# Patient Record
Sex: Male | Born: 2000 | Race: White | Hispanic: No | Marital: Single | Smoking: Never smoker
Health system: Southern US, Community
[De-identification: ages and names within clinical notes are randomized; demographics above are authoritative.]

---
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
## 2020-02-06 IMAGING — CR DG HAND COMPLETE 3+V*R*
3 series · 3 of 3 positions shown · non-contrast
Comparison: None.

CLINICAL DATA: Pain at the second, third and fourth MTP joints over
the last week. Punching injury. Swelling.

EXAM:
RIGHT HAND - COMPLETE 3+ VIEW

[hand pa]
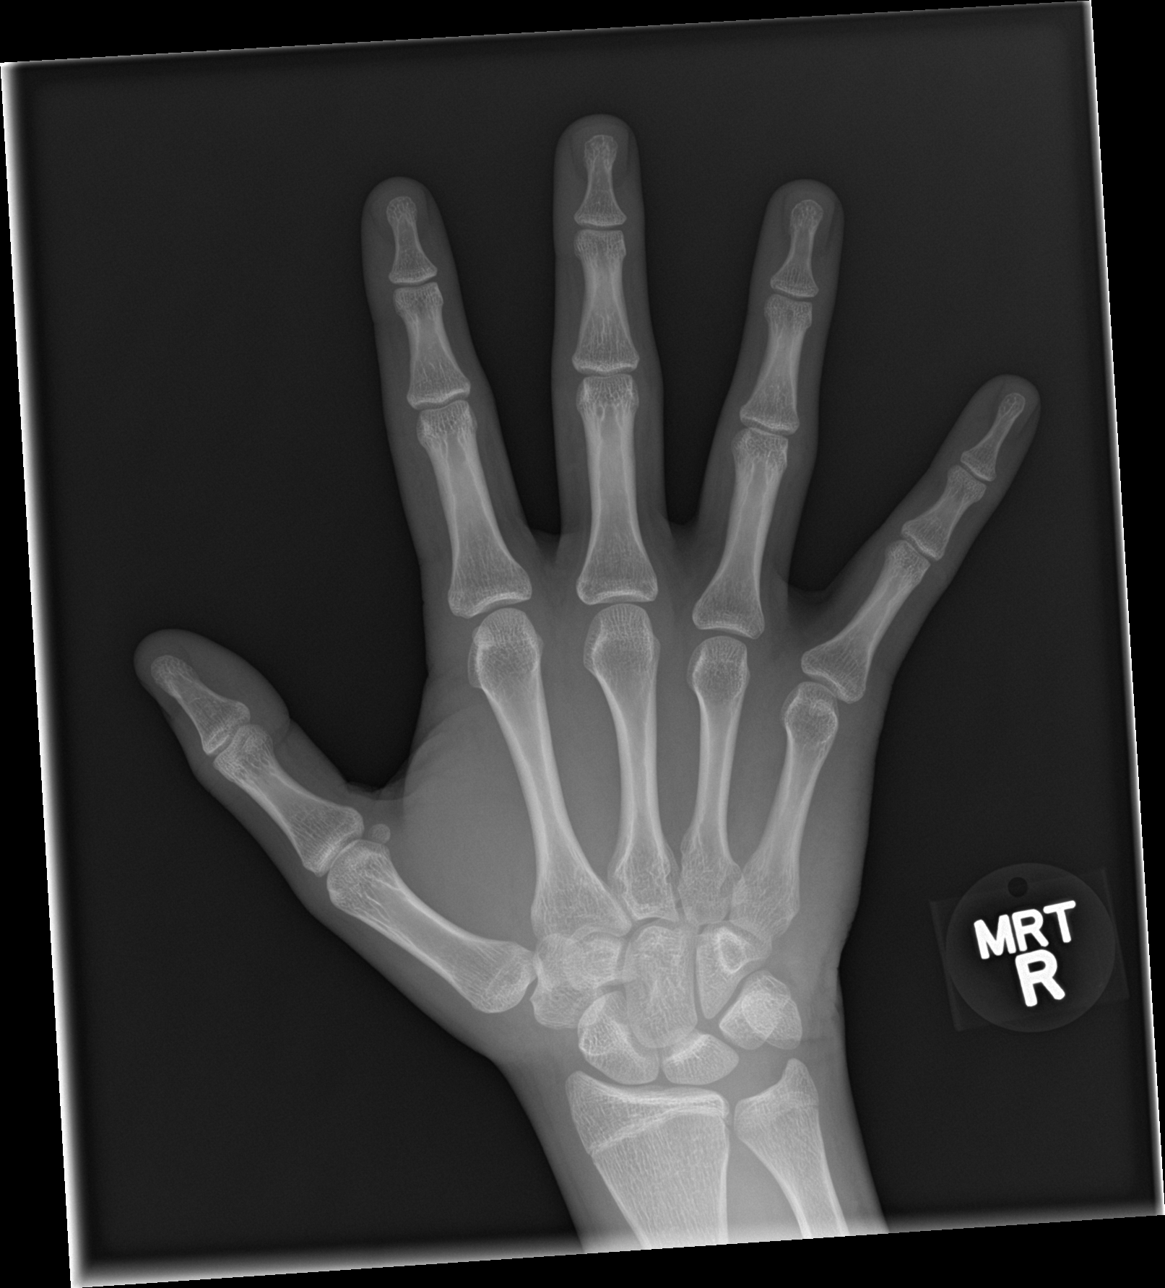

[hand obl]
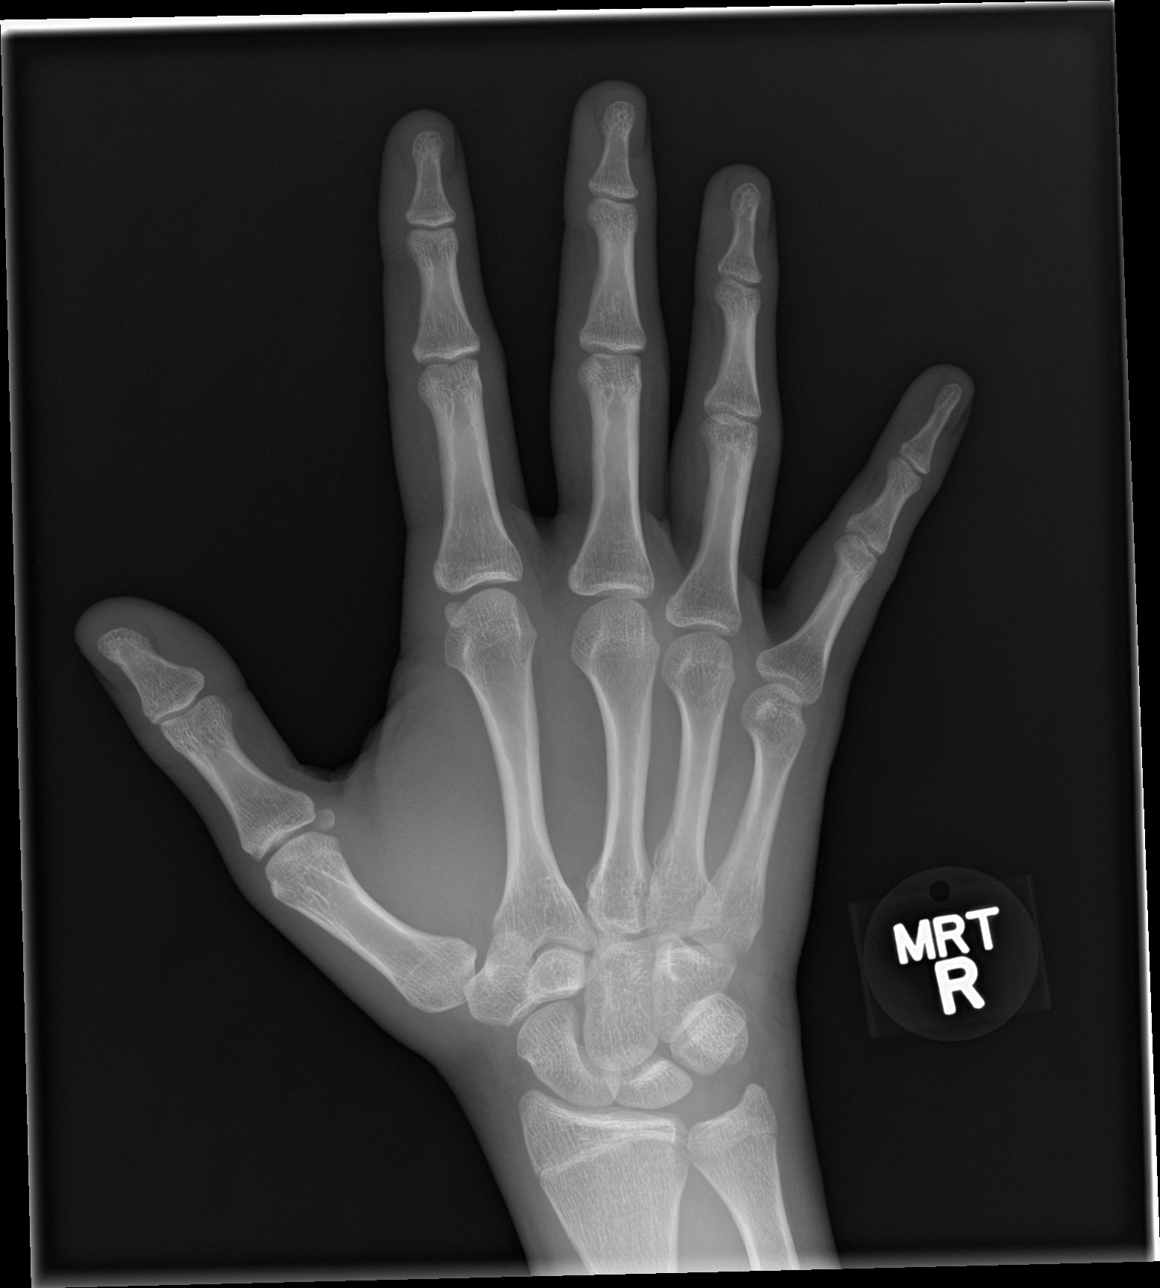

[hand lat]
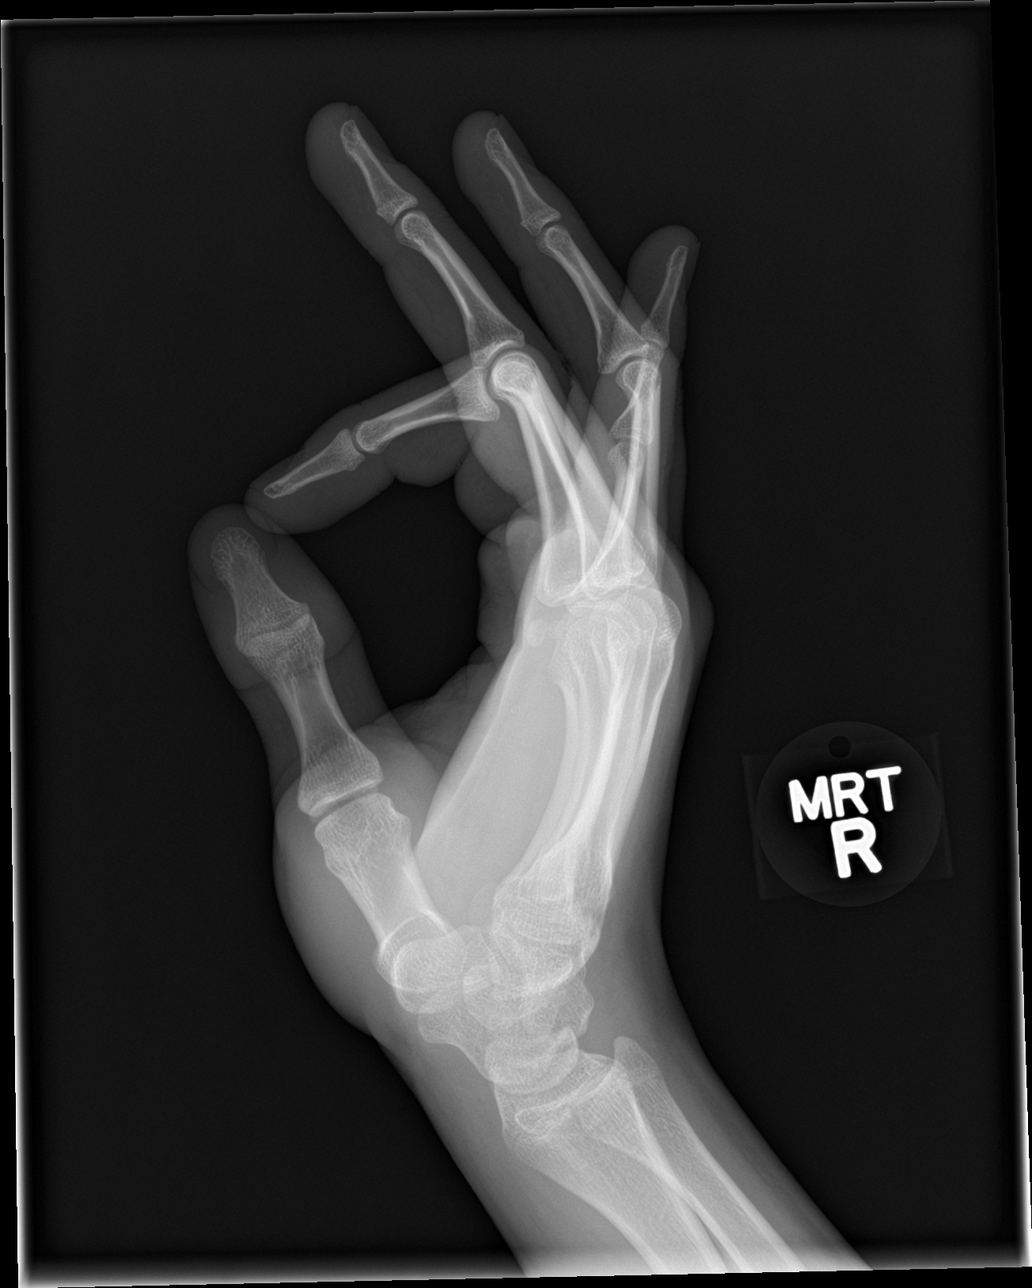

[3 of 3 positions shown; findings below may reference images not displayed]

FINDINGS: There is no evidence of fracture or dislocation. There is no
evidence of arthropathy or other focal bone abnormality. Soft
tissues are unremarkable.
IMPRESSION: Negative radiographs.

## 2020-04-20 IMAGING — DX DG HAND COMPLETE 3+V*R*
3 series · 3 of 3 positions shown · non-contrast
Comparison: None.

CLINICAL DATA: Right hand pain at third and fourth MCP joints.
Injury today.

EXAM:
RIGHT HAND - COMPLETE 3+ VIEW

[hand pa]
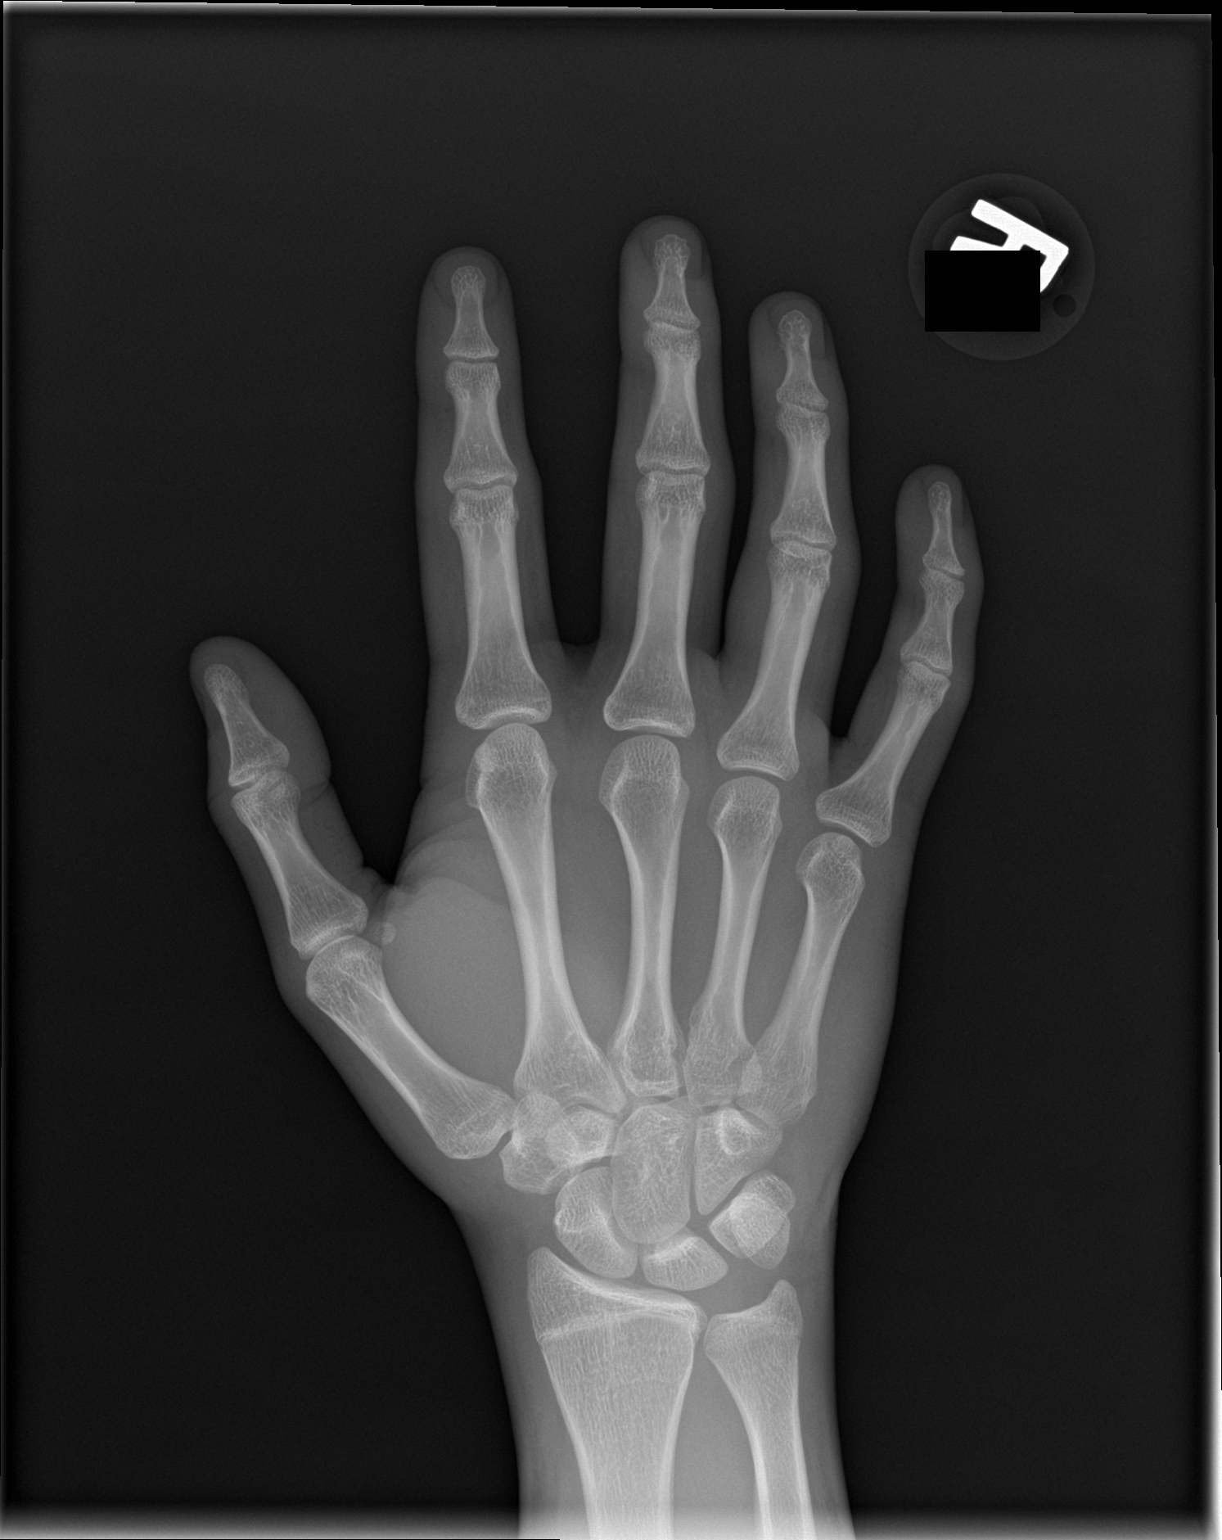

[hand obl]
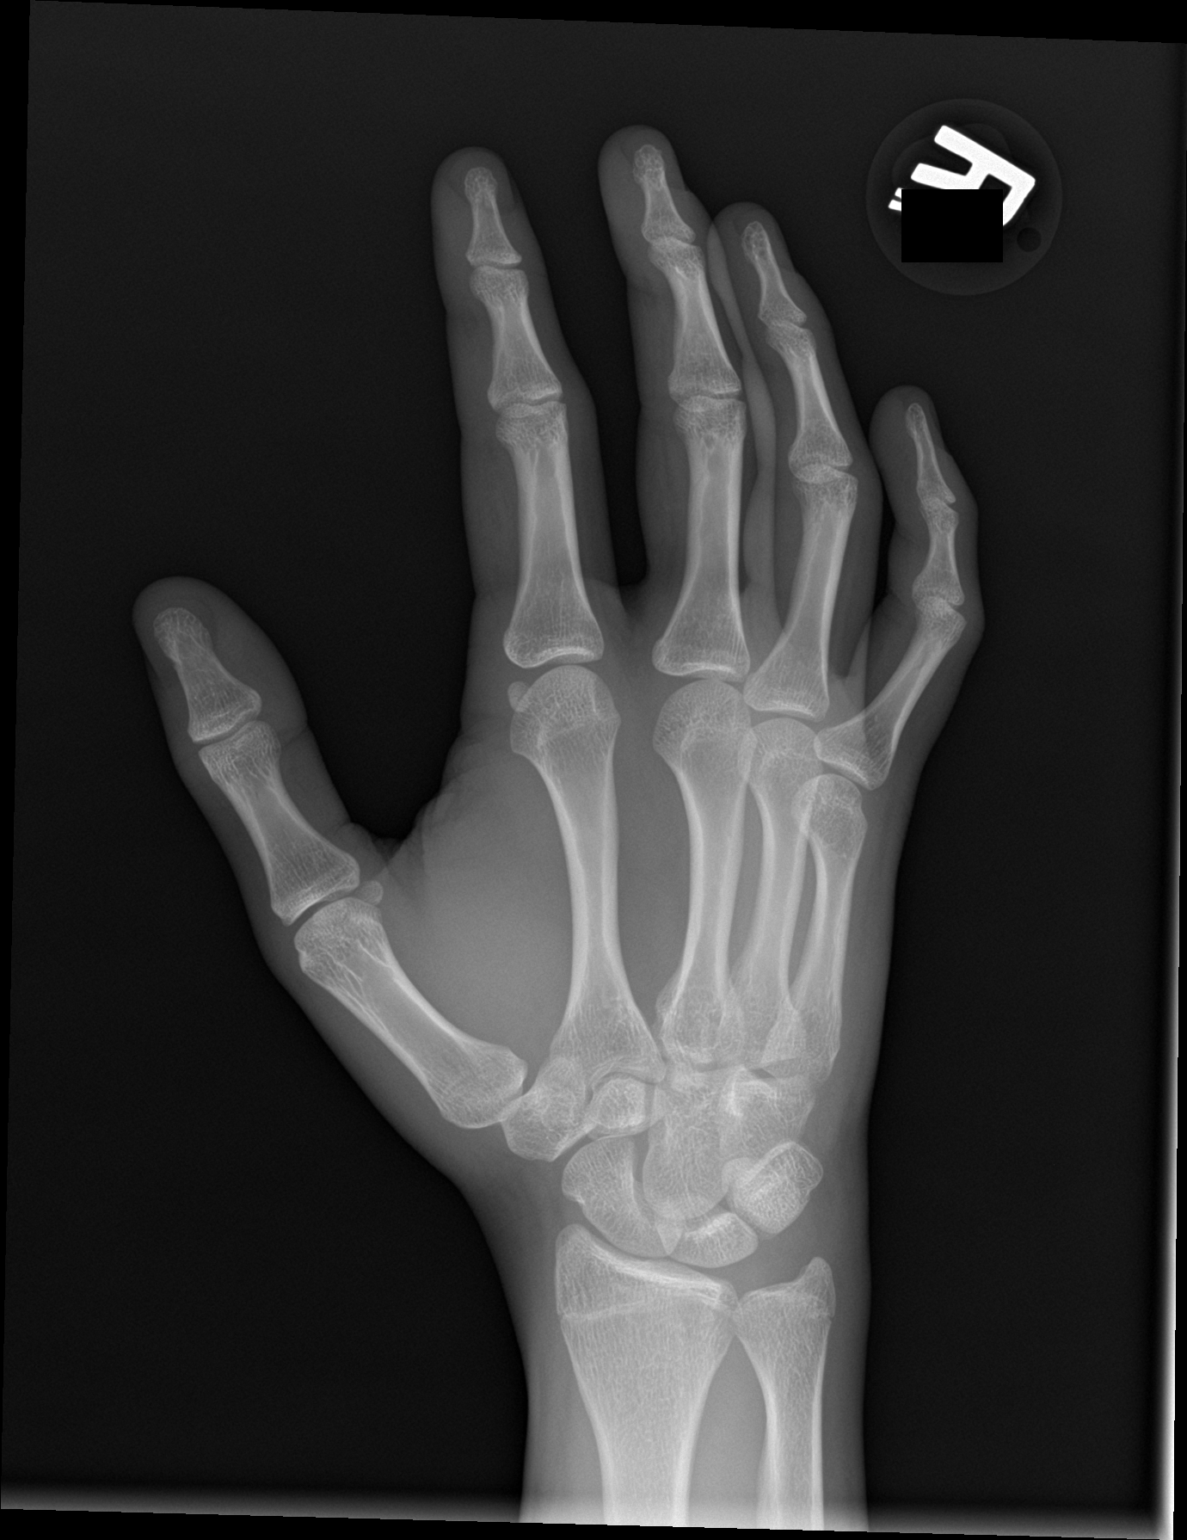

[hand lat]
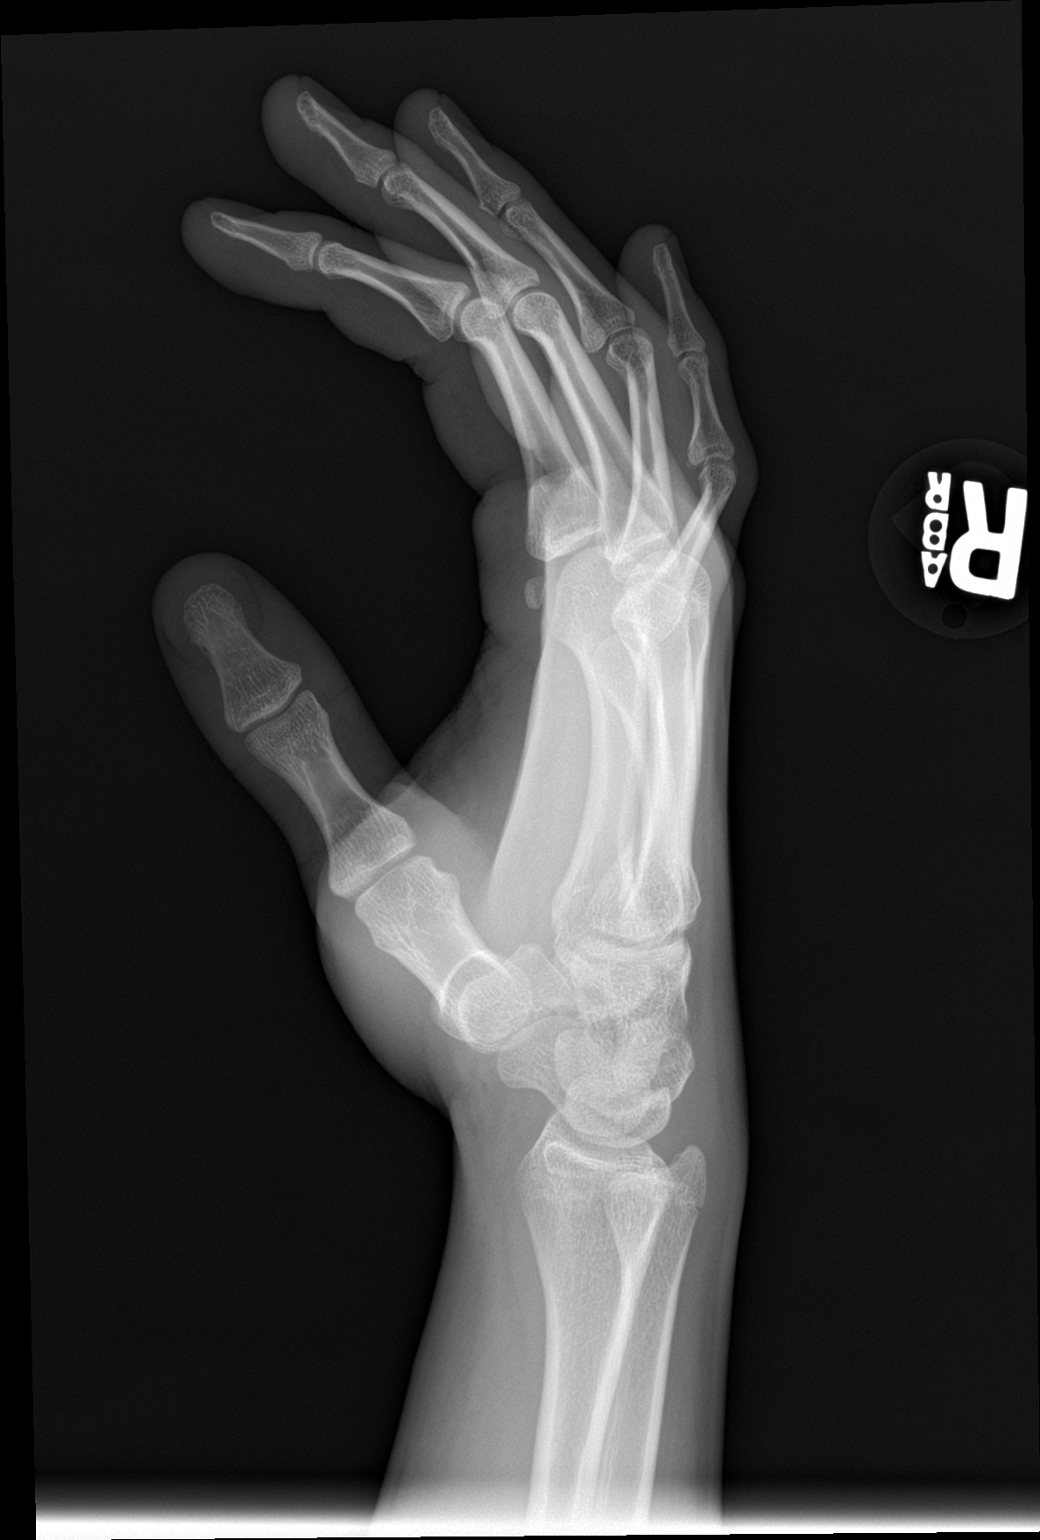

[3 of 3 positions shown; findings below may reference images not displayed]

FINDINGS: There is no evidence of fracture or dislocation. There is no
evidence of arthropathy or other focal bone abnormality. Soft
tissues are unremarkable.
IMPRESSION: Negative.

## 2021-01-03 ENCOUNTER — Emergency Department (HOSPITAL_COMMUNITY)
Admission: EM | Admit: 2021-01-03 | Discharge: 2021-01-04 | Disposition: A | Discharge status: Home / Self Care | Payer: Medicaid Other | Attending: Emergency Medicine | Admitting: Emergency Medicine

## 2021-01-03 ENCOUNTER — Encounter (HOSPITAL_COMMUNITY): Payer: Self-pay | Admitting: Emergency Medicine

## 2021-01-03 ENCOUNTER — Other Ambulatory Visit: Payer: Self-pay

## 2021-01-03 DIAGNOSIS — Z7722 Contact with and (suspected) exposure to environmental tobacco smoke (acute) (chronic): Secondary | ICD-10-CM | POA: Insufficient documentation

## 2021-01-03 DIAGNOSIS — Z23 Encounter for immunization: Secondary | ICD-10-CM | POA: Diagnosis not present

## 2021-01-03 DIAGNOSIS — S61511A Laceration without foreign body of right wrist, initial encounter: Secondary | ICD-10-CM | POA: Insufficient documentation

## 2021-01-03 DIAGNOSIS — S6991XA Unspecified injury of right wrist, hand and finger(s), initial encounter: Secondary | ICD-10-CM | POA: Diagnosis present

## 2021-01-03 DIAGNOSIS — W25XXXA Contact with sharp glass, initial encounter: Secondary | ICD-10-CM | POA: Diagnosis not present

## 2021-01-03 MED ORDER — TETANUS-DIPHTH-ACELL PERTUSSIS 5-2.5-18.5 LF-MCG/0.5 IM SUSY
0.5000 mL | PREFILLED_SYRINGE | Freq: Once | INTRAMUSCULAR | Status: AC
  Administered 2021-01-03: 0.5 mL via INTRAMUSCULAR
  Filled 2021-01-03: qty 0.5

## 2021-01-03 MED ORDER — LIDOCAINE-EPINEPHRINE (PF) 2 %-1:200000 IJ SOLN
INTRAMUSCULAR | Status: AC
  Administered 2021-01-03: 20 mL via INTRADERMAL
  Filled 2021-01-03: qty 20

## 2021-01-03 MED ORDER — LIDOCAINE-EPINEPHRINE (PF) 2 %-1:200000 IJ SOLN: 20.0000 mL | Freq: Once | INTRAMUSCULAR | Status: AC

## 2021-01-03 NOTE — ED Triage Notes (Signed)
Pt was moving a glass coffee table when it shattered cutting the top of his R wrist area.

## 2021-01-03 NOTE — ED Provider Notes (Signed)
[facility] EMERGENCY DEPARTMENT Provider Note   CSN: [id number] Arrival date & time: 01/03/21  2321     History Chief Complaint  Patient presents with  . Laceration    Angel Harvey is a 20 y.o. male.  Patient presents with complaints of laceration to the back of his wrist.  Reports that he was moving a glass table at home when the table broke and accidentally caught him.  Patient reports a fair amount of bleeding initially.  He has pain at the site, no numbness, tingling.  Able to move all fingers.        History reviewed. No pertinent past medical history.  There are no problems to display for this patient.   History reviewed. No pertinent surgical history.     History reviewed. No pertinent family history.  Social History   Tobacco Use  . Smoking status: Passive Smoke Exposure - Never Smoker  . Smokeless tobacco: Never Used  Substance Use Topics  . Alcohol use: Never  . Drug use: Never    Home Medications Prior to Admission medications   Medication Sig Start Date End Date Taking? Authorizing Provider  hyoscyamine (LEVSIN SL) 0.125 MG SL tablet Place 1 tablet (0.125 mg total) under the tongue every 4 (four) hours as needed. 10/08/17   Adelene Amas, MD    Allergies    Patient has no known allergies.  Review of Systems   Review of Systems  Skin: Positive for wound.  Neurological: Negative.     Physical Exam Updated Vital Signs BP (!) 141/91   Pulse 65   Temp 98.9 F (37.2 C) (Oral)   Resp 18   Ht 5\' 9"  (1.753 m)   Wt 77.1 kg   SpO2 99%   BMI 25.10 kg/m   Physical Exam Vitals and nursing note reviewed.  HENT:     Head: Atraumatic.  Musculoskeletal:     Comments: Laceration dorsal aspect of wrist.  Normal flexion and extension at wrist and all fingers.  Normal opposition and abduction of fingers.  Skin:    Findings: Laceration (dorsal wrist) present.  Neurological:     Mental Status: He is alert.     Cranial Nerves: Cranial nerves are  intact.     Sensory: Sensation is intact.     Motor: Motor function is intact.     Comments: Sensation intact across all III nerve distributions in hand and fingers.  Normal strength and function.     ED Results / Procedures / Treatments   Labs (all labs ordered are listed, but only abnormal results are displayed) Labs Reviewed - No data to display  EKG None  Radiology No results found.  Procedures . Laceration Repair  Date/Time: 01/04/2021 12:54 AM Performed by: 01/06/2021, MD Authorized by: Gilda Crease, MD   Consent:    Consent obtained:  Verbal   Consent given by:  Patient   Risks, benefits, and alternatives were discussed: yes     Risks discussed:  Infection, pain and retained foreign body Universal protocol:    Procedure explained and questions answered to patient or proxy's satisfaction: yes     Site/side marked: yes     Immediately prior to procedure, a time out was called: yes     Patient identity confirmed:  Verbally with patient Anesthesia:    Anesthesia method:  Local infiltration   Local anesthetic:  Lidocaine 2% WITH epi Laceration details:    Location:  Shoulder/arm   Shoulder/arm location:  R lower  arm   Length (cm):  3.5 Pre-procedure details:    Preparation:  Patient was prepped and draped in usual sterile fashion Exploration:    Hemostasis achieved with:  Direct pressure   Wound exploration: wound explored through full range of motion and entire depth of wound visualized     Wound extent: no fascia violation noted, no foreign bodies/material noted, no muscle damage noted, no nerve damage noted and no tendon damage noted     Contaminated: no   Treatment:    Area cleansed with:  Chlorhexidine   Amount of cleaning:  Standard   Irrigation solution:  Sterile saline   Irrigation method:  Syringe   Debridement:  None   Undermining:  None Skin repair:    Repair method:  Sutures   Suture size:  3-0   Suture material:  Nylon    Number of sutures:  5 Approximation:    Approximation:  Close Repair type:    Repair type:  Simple Post-procedure details:    Dressing:  Adhesive bandage   Procedure completion:  Tolerated well, no immediate complications     Medications Ordered in ED Medications  lidocaine-EPINEPHrine (XYLOCAINE W/EPI) 2 %-1:200000 (PF) injection 20 mL (20 mLs Intradermal Given 01/03/21 2349)  Tdap (BOOSTRIX) injection 0.5 mL (0.5 mLs Intramuscular Given 01/03/21 2352)    ED Course  I have reviewed the triage vital signs and the nursing notes.  Pertinent labs & imaging results that were available during my care of the patient were reviewed by me and considered in my medical decision making (see chart for details).    MDM Rules/Calculators/A&P                          Patient presents to the emergency department for laceration on dorsal aspect of right wrist.  Patient excellently cut himself on glass.  Wound was explored through full range of motion.  Laceration through all layers of skin but does not violate underlying muscle or fascia.  No tendon involvement.  Patient has normal range of motion of fingers including flexion, extension, opposition.  Sensation intact across all nerves.  Final Clinical Impression(s) / ED Diagnoses Final diagnoses:  Wrist laceration, right, initial encounter    Rx / DC Orders ED Discharge Orders    None       Rebekah Sprinkle, Canary Brim, MD 01/04/21 [phone]

## 2021-01-04 NOTE — Discharge Instructions (Signed)
Follow-up with your primary doctor, urgent care or return to this ER for suture removal in 10 days.

## 2021-01-14 ENCOUNTER — Ambulatory Visit
Admission: EM | Admit: 2021-01-14 | Discharge: 2021-01-14 | Disposition: A | Discharge status: Home / Self Care | Payer: Medicaid Other

## 2021-01-14 NOTE — ED Triage Notes (Signed)
Pt here for suture removal, 5 stiches removed from right wrist, skin is well approximated

## 2021-05-30 ENCOUNTER — Other Ambulatory Visit: Payer: Self-pay

## 2021-05-30 ENCOUNTER — Encounter: Payer: Self-pay | Admitting: Emergency Medicine

## 2021-05-30 ENCOUNTER — Ambulatory Visit
Admission: EM | Admit: 2021-05-30 | Discharge: 2021-05-30 | Disposition: A | Discharge status: Home / Self Care | Payer: Medicaid Other | Attending: Emergency Medicine | Admitting: Emergency Medicine

## 2021-05-30 DIAGNOSIS — Z20822 Contact with and (suspected) exposure to covid-19: Secondary | ICD-10-CM | POA: Diagnosis not present

## 2021-05-30 DIAGNOSIS — J069 Acute upper respiratory infection, unspecified: Secondary | ICD-10-CM | POA: Diagnosis not present

## 2021-05-30 MED ORDER — BENZONATATE 100 MG PO CAPS: 100.0000 mg | ORAL_CAPSULE | Freq: Three times a day (TID) | ORAL | 0 refills | Status: DC

## 2021-05-30 NOTE — Discharge Instructions (Signed)
COVID testing ordered.  It will take between 3-5 days for test results.  Someone will contact you regarding abnormal results.    In the meantime: You should remain isolated in your home for 5 days from symptom onset AND greater than 72 hours after symptoms resolution (absence of fever without the use of fever-reducing medication and improvement in respiratory symptoms), whichever is longer Get plenty of rest and push fluids Tessalon Perles prescribed for cough Use OTC zyrtec for nasal congestion, runny nose, and/or sore throat Use OTC flonase for nasal congestion and runny nose Use medications daily for symptom relief Use OTC medications like ibuprofen or tylenol as needed fever or pain Call or go to the ED if you have any new or worsening symptoms such as fever, worsening cough, shortness of breath, chest tightness, chest pain, turning blue, changes in mental status, etc...  

## 2021-05-30 NOTE — ED Provider Notes (Signed)
[facility]   [id number] 05/30/21 Arrival Time: 1802   CC: COVID symptoms  SUBJECTIVE: History from: patient.  Angel Harvey is a 20 y.o. male who presents with cough, congestion, facial pressure x 3-4 days.  Denies sick exposure to COVID, flu or strep.  Denies alleviating or aggravating factors.  Reports previous symptoms in the past with allergies.  Has had covid in the past.   Denies fever, SOB, wheezing, chest pain, nausea, changes in bowel or bladder habits.     ROS: As per HPI.  All other pertinent ROS negative.     History reviewed. No pertinent past medical history. History reviewed. No pertinent surgical history. No Known Allergies No current facility-administered medications on file prior to encounter.   Current Outpatient Medications on File Prior to Encounter  Medication Sig Dispense Refill   hyoscyamine (LEVSIN SL) 0.125 MG SL tablet Place 1 tablet (0.125 mg total) under the tongue every 4 (four) hours as needed. 30 tablet 0   Social History   Socioeconomic History   Marital status: Single    Spouse name: Not on file   Number of children: Not on file   Years of education: Not on file   Highest education level: Not on file  Occupational History   Not on file  Tobacco Use   Smoking status: Passive Smoke Exposure - Never Smoker   Smokeless tobacco: Never  Substance and Sexual Activity   Alcohol use: Never   Drug use: Never   Sexual activity: Not on file  Other Topics Concern   Not on file  Social History Narrative   Not on file   Social Determinants of Health   Financial Resource Strain: Not on file  Food Insecurity: Not on file  Transportation Needs: Not on file  Physical Activity: Not on file  Stress: Not on file  Social Connections: Not on file  Intimate Partner Violence: Not on file   History reviewed. No pertinent family history.  OBJECTIVE:  Vitals:   05/30/21 1822  BP: 130/82  Pulse: 95  Resp: 18  Temp: 99.3 F (37.4 C)   TempSrc: Oral  SpO2: 98%     General appearance: alert; appears fatigued, but nontoxic; speaking in full sentences and tolerating own secretions HEENT: NCAT; Ears: EACs clear, TMs pearly gray; Eyes: PERRL.  EOM grossly intact. Nose: nares patent with clear rhinorrhea, Throat: oropharynx clear, tonsils non erythematous or enlarged, uvula midline  Neck: supple without LAD Lungs: unlabored respirations, symmetrical air entry; cough: mild; no respiratory distress; CTAB Heart: regular rate and rhythm.   Skin: warm and dry Psychological: alert and cooperative; normal mood and affect  ASSESSMENT & PLAN:  1. Exposure to COVID-19 virus   2. Viral URI with cough     Meds ordered this encounter  Medications   benzonatate (TESSALON) 100 MG capsule    Sig: Take 1 capsule (100 mg total) by mouth every 8 (eight) hours.    Dispense:  21 capsule    Refill:  0    Order Specific Question:   Supervising Provider    Answer:   Eustace Moore [[id number]]     COVID testing ordered.  It will take between 3-5 days for test results.  Someone will contact you regarding abnormal results.    In the meantime: You should remain isolated in your home for 5 days from symptom onset AND greater than 72 hours after symptoms resolution (absence of fever without the use of fever-reducing medication and improvement in  respiratory symptoms), whichever is longer Get plenty of rest and push fluids Tessalon Perles prescribed for cough Use OTC zyrtec for nasal congestion, runny nose, and/or sore throat Use OTC flonase for nasal congestion and runny nose Use medications daily for symptom relief Use OTC medications like ibuprofen or tylenol as needed fever or pain Call or go to the ED if you have any new or worsening symptoms such as fever, worsening cough, shortness of breath, chest tightness, chest pain, turning blue, changes in mental status, etc...   Reviewed expectations re: course of current medical issues.  Questions answered. Outlined signs and symptoms indicating need for more acute intervention. Patient verbalized understanding. After Visit Summary given.          Rennis Harding, PA-C 05/30/21 1831

## 2021-05-30 NOTE — ED Triage Notes (Signed)
Chest congestion, nasal congestion, facial pressure, productive cough x 3 to 4 days.

## 2021-05-31 LAB — COVID-19, FLU A+B NAA
Influenza A, NAA: NOT DETECTED
Influenza B, NAA: NOT DETECTED
SARS-CoV-2, NAA: NOT DETECTED

## 2023-05-13 ENCOUNTER — Ambulatory Visit
Admission: EM | Admit: 2023-05-13 | Discharge: 2023-05-13 | Disposition: A | Discharge status: Home / Self Care | Payer: PRIVATE HEALTH INSURANCE | Attending: Nurse Practitioner | Admitting: Nurse Practitioner

## 2023-05-13 ENCOUNTER — Encounter: Payer: Self-pay | Admitting: Emergency Medicine

## 2023-05-13 DIAGNOSIS — L282 Other prurigo: Secondary | ICD-10-CM

## 2023-05-13 DIAGNOSIS — B880 Other acariasis: Secondary | ICD-10-CM

## 2023-05-13 MED ORDER — TRIAMCINOLONE ACETONIDE 0.1 % EX CREA: 1.0000 | TOPICAL_CREAM | Freq: Two times a day (BID) | CUTANEOUS | 0 refills | Status: AC

## 2023-05-13 MED ORDER — PREDNISONE 20 MG PO TABS: 40.0000 mg | ORAL_TABLET | Freq: Every day | ORAL | 0 refills | Status: AC

## 2023-05-13 MED ORDER — DEXAMETHASONE SODIUM PHOSPHATE 10 MG/ML IJ SOLN
10.0000 mg | INTRAMUSCULAR | Status: AC
  Administered 2023-05-13: 10 mg via INTRAMUSCULAR

## 2023-05-13 NOTE — ED Triage Notes (Signed)
Went camping over the weekend.  Woke up Sunday with multiple insect bites all over body.  Left lower leg is worse.  States areas itch and throb.

## 2023-05-13 NOTE — ED Provider Notes (Signed)
[facility]    CSN: [id number] Arrival date & time: 05/13/23  1515      History   Chief Complaint No chief complaint on file.   HPI Angel Harvey is a 22 y.o. male.   The history is provided by the patient.   Patient presents for complaints of insect bites to his lower legs-present for the past several days.  Patient states over the weekend, he and his friends went camping.  States when he woke up the next day, he had insect bites all over his body.  He states symptoms are worse in the left leg.  He states the areas are itchy, and he has throbbing in the left leg.  He denies fever, chills, chest pain, abdominal pain, exposure to new soaps, foods, medications, lotions, or detergent.  Patient reports he has been using over-the-counter calamine lotion and taking Benadryl for his symptoms.  History reviewed. No pertinent past medical history.  There are no problems to display for this patient.   History reviewed. No pertinent surgical history.     Home Medications    Prior to Admission medications   Medication Sig Start Date End Date Taking? Authorizing Provider  predniSONE (DELTASONE) 20 MG tablet Take 2 tablets (40 mg total) by mouth daily with breakfast for 5 days. 05/13/23 05/18/23 Yes Khrystyne Arpin-Warren, Sadie Haber, NP  triamcinolone cream (KENALOG) 0.1 % Apply 1 Application topically 2 (two) times daily. 05/13/23  Yes Adiya Selmer-Warren, Sadie Haber, NP    Family History History reviewed. No pertinent family history.  Social History Social History   Tobacco Use   Smoking status: Never    Passive exposure: Yes   Smokeless tobacco: Never  Vaping Use   Vaping status: Every Day   Substances: Nicotine  Substance Use Topics   Alcohol use: Never   Drug use: Never     Allergies   Patient has no known allergies.   Review of Systems Review of Systems Per HPI  Physical Exam Triage Vital Signs ED Triage Vitals  Encounter Vitals Group     BP 05/13/23 1528 125/69      Systolic BP Percentile --      Diastolic BP Percentile --      Pulse Rate 05/13/23 1528 72     Resp 05/13/23 1528 18     Temp 05/13/23 1528 97.7 F (36.5 C)     Temp Source 05/13/23 1528 Oral     SpO2 05/13/23 1528 98 %     Weight --      Height --      Head Circumference --      Peak Flow --      Pain Score 05/13/23 1530 4     Pain Loc --      Pain Education --      Exclude from Growth Chart --    No data found.  Updated Vital Signs BP 125/69 (BP Location: Right Arm)   Pulse 72   Temp 97.7 F (36.5 C) (Oral)   Resp 18   SpO2 98%   Visual Acuity Right Eye Distance:   Left Eye Distance:   Bilateral Distance:    Right Eye Near:   Left Eye Near:    Bilateral Near:     Physical Exam Vitals and nursing note reviewed.  Constitutional:      General: He is not in acute distress.    Appearance: Normal appearance.  HENT:     Head: Normocephalic.  Eyes:  Extraocular Movements: Extraocular movements intact.     Pupils: Pupils are equal, round, and reactive to light.  Pulmonary:     Effort: Pulmonary effort is normal.  Musculoskeletal:     Cervical back: Normal range of motion.  Lymphadenopathy:     Cervical: No cervical adenopathy.  Skin:    General: Skin is warm and dry.     Comments: Numerous erythematous papules noted to the generalized body surface area.  Papules are no congruent pattern, increased in the left lower extremity.  There is no oozing, fluctuance, or drainage present.  Areas are consistent with chigger bites.  Neurological:     General: No focal deficit present.     Mental Status: He is alert and oriented to person, place, and time.  Psychiatric:        Mood and Affect: Mood normal.        Behavior: Behavior normal.      UC Treatments / Results  Labs (all labs ordered are listed, but only abnormal results are displayed) Labs Reviewed - No data to display  EKG   Radiology No results found.  Procedures Procedures (including critical  care time)  Medications Ordered in UC Medications  dexamethasone (DECADRON) injection 10 mg (10 mg Intramuscular Given 05/13/23 1547)    Initial Impression / Assessment and Plan / UC Course  I have reviewed the triage vital signs and the nursing notes.  Pertinent labs & imaging results that were available during my care of the patient were reviewed by me and considered in my medical decision making (see chart for details).  The patient is well-appearing, he is in no acute distress, vital signs are stable.  Patient with chigger bites to the generalized body surface area.  Decadron 10 mg IM administered to help with itching.  Start patient on prednisone 40 mg for the next 5 days for continued itching and inflammation.  Triamcinolone cream 0.1% was also prescribed to apply topically for itching.  Supportive care recommendations were provided and discussed with the patient to include over-the-counter analgesics for pain or discomfort, cool compresses to the affected areas to help with itching, and avoiding itching or scratching the areas while symptoms persist.  Patient was advised to follow-up if symptoms do not improve over the next 1 to 2 weeks, or sooner if symptoms worsen.  Patient is in agreement with this plan of care and verbalizes understanding.  All questions were answered.  Patient stable for discharge.   Final Clinical Impressions(s) / UC Diagnoses   Final diagnoses:  Papular urticaria  Chigger bites     Discharge Instructions      You were given an injection of Decadron 10 mg.  Start the prednisone on 05/14/2023. Take medication as prescribed. Continue over-the-counter ibuprofen or Tylenol for pain or discomfort. Cool compresses to the affected areas to help with itching.  Avoid hot baths or showers while symptoms persist.  Recommend lukewarm baths. Try to avoid scratching or manipulating the areas while symptoms persist. Continue over-the-counter Benadryl at bedtime or Zyrtec  during the daytime to help with itching. If symptoms do not improve over the next 1 to 2 weeks, or suddenly worsen, please follow-up in this clinic or with your primary care physician for further evaluation. Follow-up as needed.     ED Prescriptions     Medication Sig Dispense Auth. Provider   predniSONE (DELTASONE) 20 MG tablet Take 2 tablets (40 mg total) by mouth daily with breakfast for 5 days. 10 tablet Jasey Cortez-Warren,  Sadie Haber, NP   triamcinolone cream (KENALOG) 0.1 % Apply 1 Application topically 2 (two) times daily. 80 g Nola Botkins-Warren, Sadie Haber, NP      PDMP not reviewed this encounter.   Abran Cantor, NP 05/13/23 1556

## 2023-05-13 NOTE — Discharge Instructions (Addendum)
You were given an injection of Decadron 10 mg.  Start the prednisone on 05/14/2023. Take medication as prescribed. Continue over-the-counter ibuprofen or Tylenol for pain or discomfort. Cool compresses to the affected areas to help with itching.  Avoid hot baths or showers while symptoms persist.  Recommend lukewarm baths. Try to avoid scratching or manipulating the areas while symptoms persist. Continue over-the-counter Benadryl at bedtime or Zyrtec during the daytime to help with itching. If symptoms do not improve over the next 1 to 2 weeks, or suddenly worsen, please follow-up in this clinic or with your primary care physician for further evaluation. Follow-up as needed.
